# Patient Record
Sex: Male | Born: 1985 | Race: Black or African American | Hispanic: No | Marital: Single | Smoking: Never smoker
Health system: Southern US, Community
[De-identification: ages and names within clinical notes are randomized; demographics above are authoritative.]

---

## 2017-06-13 ENCOUNTER — Emergency Department (HOSPITAL_COMMUNITY)
Admission: EM | Admit: 2017-06-13 | Discharge: 2017-06-14 | Disposition: A | Discharge status: Home / Self Care | Payer: Medicaid Other | Attending: Emergency Medicine | Admitting: Emergency Medicine

## 2017-06-13 ENCOUNTER — Emergency Department (HOSPITAL_COMMUNITY): Payer: Medicaid Other

## 2017-06-13 ENCOUNTER — Encounter (HOSPITAL_COMMUNITY): Payer: Self-pay | Admitting: Emergency Medicine

## 2017-06-13 DIAGNOSIS — J020 Streptococcal pharyngitis: Secondary | ICD-10-CM

## 2017-06-13 LAB — CBC WITH DIFFERENTIAL/PLATELET
BASOS ABS: 0 10*3/uL (ref 0.0–0.1)
BASOS PCT: 0 %
EOS ABS: 0 10*3/uL (ref 0.0–0.7)
Eosinophils Relative: 0 %
HEMATOCRIT: 44.2 % (ref 39.0–52.0)
HEMOGLOBIN: 15.2 g/dL (ref 13.0–17.0)
Lymphocytes Relative: 9 %
Lymphs Abs: 1.4 10*3/uL (ref 0.7–4.0)
MCH: 27.9 pg (ref 26.0–34.0)
MCHC: 34.4 g/dL (ref 30.0–36.0)
MCV: 81.3 fL (ref 78.0–100.0)
MONO ABS: 0.8 10*3/uL (ref 0.1–1.0)
Monocytes Relative: 5 %
Neutro Abs: 12.9 10*3/uL — ABNORMAL HIGH (ref 1.7–7.7)
Neutrophils Relative %: 86 %
Platelets: 193 10*3/uL (ref 150–400)
RBC: 5.44 MIL/uL (ref 4.22–5.81)
RDW: 12.6 % (ref 11.5–15.5)
WBC: 15.1 10*3/uL — AB (ref 4.0–10.5)

## 2017-06-13 LAB — COMPREHENSIVE METABOLIC PANEL
ALBUMIN: 3.8 g/dL (ref 3.5–5.0)
ALK PHOS: 64 U/L (ref 38–126)
ALT: 18 U/L (ref 17–63)
AST: 24 U/L (ref 15–41)
Anion gap: 11 (ref 5–15)
BILIRUBIN TOTAL: 0.9 mg/dL (ref 0.3–1.2)
BUN: 11 mg/dL (ref 6–20)
CALCIUM: 8.9 mg/dL (ref 8.9–10.3)
CO2: 24 mmol/L (ref 22–32)
Chloride: 99 mmol/L — ABNORMAL LOW (ref 101–111)
Creatinine, Ser: 1.3 mg/dL — ABNORMAL HIGH (ref 0.61–1.24)
GFR calc Af Amer: >60 mL/min (ref >60)
GLUCOSE: 110 mg/dL — AB (ref 65–99)
Potassium: 3.3 mmol/L — ABNORMAL LOW (ref 3.5–5.1)
Sodium: 134 mmol/L — ABNORMAL LOW (ref 135–145)
TOTAL PROTEIN: 7.1 g/dL (ref 6.5–8.1)

## 2017-06-13 LAB — URINALYSIS, ROUTINE W REFLEX MICROSCOPIC
Bilirubin Urine: NEGATIVE
Glucose, UA: NEGATIVE mg/dL
Ketones, ur: NEGATIVE mg/dL
LEUKOCYTES UA: NEGATIVE
NITRITE: NEGATIVE
PROTEIN: 30 mg/dL — AB
Specific Gravity, Urine: 1.028 (ref 1.005–1.030)
pH: 7 (ref 5.0–8.0)

## 2017-06-13 LAB — I-STAT CG4 LACTIC ACID, ED: Lactic Acid, Venous: 1.65 mmol/L (ref 0.5–1.9)

## 2017-06-13 MED ORDER — ACETAMINOPHEN 325 MG PO TABS
650.0000 mg | ORAL_TABLET | Freq: Once | ORAL | Status: AC | PRN
  Administered 2017-06-13: 650 mg via ORAL
  Filled 2017-06-13: qty 2

## 2017-06-13 NOTE — ED Triage Notes (Signed)
Patient presents to ED for assessment of fever, sudden onset this evening, body aches.  Denies cough, denies congestion, denies sore throat, denies known exposure

## 2017-06-14 ENCOUNTER — Emergency Department (HOSPITAL_COMMUNITY): Payer: Medicaid Other

## 2017-06-14 LAB — GROUP A STREP BY PCR: GROUP A STREP BY PCR: DETECTED — AB

## 2017-06-14 MED ORDER — PENICILLIN V POTASSIUM 500 MG PO TABS: 500.0000 mg | ORAL_TABLET | Freq: Four times a day (QID) | ORAL | 0 refills | Status: AC

## 2017-06-14 MED ORDER — PENICILLIN V POTASSIUM 250 MG PO TABS
500.0000 mg | ORAL_TABLET | Freq: Once | ORAL | Status: AC
  Administered 2017-06-14: 500 mg via ORAL
  Filled 2017-06-14: qty 2

## 2017-06-14 MED ORDER — DEXAMETHASONE SODIUM PHOSPHATE 10 MG/ML IJ SOLN
10.0000 mg | Freq: Once | INTRAMUSCULAR | Status: AC
  Administered 2017-06-14: 10 mg via INTRAVENOUS
  Filled 2017-06-14: qty 1

## 2017-06-14 MED ORDER — SODIUM CHLORIDE 0.9 % IV BOLUS
1000.0000 mL | Freq: Once | INTRAVENOUS | Status: AC
  Administered 2017-06-14: 1000 mL via INTRAVENOUS

## 2017-06-14 MED ORDER — IBUPROFEN 400 MG PO TABS
600.0000 mg | ORAL_TABLET | Freq: Once | ORAL | Status: AC
  Administered 2017-06-14: 600 mg via ORAL
  Filled 2017-06-14: qty 1

## 2017-06-14 NOTE — Discharge Instructions (Addendum)
Try to drink lots of fluids. Sometimes cold liquids are easier to drink with the strep throat. Take ibuprofen 600 mg + acetaminophen 1000 mg every 6 hrs for fever and body aches. Return to the ED if you are unable to swallow, have difficulty breathing or you feel like you are dehydrated.  You are still contagious until you have been on the antibiotics for at least 24 hours.  Do not let anybody kiss you, drink out of your cup, or share your silverware.

## 2017-06-14 NOTE — ED Provider Notes (Signed)
[facility] EMERGENCY DEPARTMENT Provider Note   CSN: [id number] Arrival date & time: 06/13/17  2043  Time seen 3:19 AM   History   Chief Complaint Chief Complaint  Patient presents with  . Fever    HPI Michael Ellis is a 32 y.o. male.  HPI patient states that this morning he started feeling cold and having diffuse body aches.  He states he has a holoacranial headache that is steady without throbbing.  Nothing makes it feel worse, nothing makes it feel better.  He denies any visual changes, photophobia, coughing, rhinorrhea, sneezing, earache, nausea, vomiting, diarrhea, chest pain, or abdominal pain.  He states sometimes he feels a little short of breath.  Sometimes he has some phonophobia.  He denies any neck pain.  He denies being around anybody who is sick.  He states he did not get the flu shot this season.  PCP none  History reviewed. No pertinent past medical history.  There are no active problems to display for this patient.   History reviewed. No pertinent surgical history.      Home Medications    none  Prior to Admission medications   Medication Sig Start Date End Date Taking? Authorizing Provider  penicillin v potassium (VEETID) 500 MG tablet Take 1 tablet (500 mg total) by mouth 4 (four) times daily for 7 days. 06/14/17 06/21/17  Devoria Albe, MD    Family History History reviewed. No pertinent family history.  Social History Social History   Tobacco Use  . Smoking status: Never Smoker  . Smokeless tobacco: Never Used  Substance Use Topics  . Alcohol use: Yes    Alcohol/week: 0.6 oz    Types: 1 Cans of beer per week    Comment: "most days"  . Drug use: Never  employed   Allergies   Patient has no allergy information on record.   Review of Systems Review of Systems  All other systems reviewed and are negative.    Physical Exam Updated Vital Signs BP 102/71   Pulse (!) 111   Temp (!) 102 F (38.9 C) (Oral)   Resp 16    SpO2 96%   Vital signs normal except for fever and tachycardia   Physical Exam  Constitutional: He is oriented to person, place, and time. He appears well-developed and well-nourished.  Non-toxic appearance. He does not appear ill. No distress.  HENT:  Head: Normocephalic and atraumatic.  Right Ear: External ear normal.  Left Ear: External ear normal.  Nose: Nose normal. No mucosal edema or rhinorrhea.  Mouth/Throat: Uvula is midline and mucous membranes are normal. No dental abscesses or uvula swelling. Posterior oropharyngeal erythema present.  Speech is soft but normal  Eyes: Pupils are equal, round, and reactive to light. Conjunctivae and EOM are normal.  Neck: Normal range of motion and full passive range of motion without pain. Neck supple.  Cardiovascular: Normal rate, regular rhythm and normal heart sounds. Exam reveals no gallop and no friction rub.  No murmur heard. Pulmonary/Chest: Effort normal and breath sounds normal. No respiratory distress. He has no wheezes. He has no rhonchi. He has no rales. He exhibits no tenderness and no crepitus.  Abdominal: Soft. Normal appearance and bowel sounds are normal. He exhibits no distension. There is no tenderness. There is no rebound and no guarding.  Musculoskeletal: Normal range of motion. He exhibits no edema or tenderness.  Moves all extremities well.   Neurological: He is alert and oriented to person, place, and  time. He has normal strength. No cranial nerve deficit.  Skin: Skin is warm, dry and intact. No rash noted. No erythema. No pallor.  Psychiatric: He has a normal mood and affect. His speech is normal and behavior is normal. His mood appears not anxious.  Nursing note and vitals reviewed.    ED Treatments / Results  Labs (all labs ordered are listed, but only abnormal results are displayed) Results for orders placed or performed during the hospital encounter of 06/13/17  Group A Strep by PCR  Result Value Ref Range    Group A Strep by PCR DETECTED (A) NOT DETECTED  Comprehensive metabolic panel  Result Value Ref Range   Sodium 134 (L) 135 - 145 mmol/L   Potassium 3.3 (L) 3.5 - 5.1 mmol/L   Chloride 99 (L) 101 - 111 mmol/L   CO2 24 22 - 32 mmol/L   Glucose, Bld 110 (H) 65 - 99 mg/dL   BUN 11 6 - 20 mg/dL   Creatinine, Ser 7.82 (H) 0.61 - 1.24 mg/dL   Calcium 8.9 8.9 - 95.6 mg/dL   Total Protein 7.1 6.5 - 8.1 g/dL   Albumin 3.8 3.5 - 5.0 g/dL   AST 24 15 - 41 U/L   ALT 18 17 - 63 U/L   Alkaline Phosphatase 64 38 - 126 U/L   Total Bilirubin 0.9 0.3 - 1.2 mg/dL   GFR calc non Af Amer >60 >60 mL/min   GFR calc Af Amer >60 >60 mL/min   Anion gap 11 5 - 15  CBC with Differential  Result Value Ref Range   WBC 15.1 (H) 4.0 - 10.5 K/uL   RBC 5.44 4.22 - 5.81 MIL/uL   Hemoglobin 15.2 13.0 - 17.0 g/dL   HCT 21.3 08.6 - 57.8 %   MCV 81.3 78.0 - 100.0 fL   MCH 27.9 26.0 - 34.0 pg   MCHC 34.4 30.0 - 36.0 g/dL   RDW 46.9 62.9 - 52.8 %   Platelets 193 150 - 400 K/uL   Neutrophils Relative % 86 %   Neutro Abs 12.9 (H) 1.7 - 7.7 K/uL   Lymphocytes Relative 9 %   Lymphs Abs 1.4 0.7 - 4.0 K/uL   Monocytes Relative 5 %   Monocytes Absolute 0.8 0.1 - 1.0 K/uL   Eosinophils Relative 0 %   Eosinophils Absolute 0.0 0.0 - 0.7 K/uL   Basophils Relative 0 %   Basophils Absolute 0.0 0.0 - 0.1 K/uL  Urinalysis, Routine w reflex microscopic  Result Value Ref Range   Color, Urine YELLOW YELLOW   APPearance CLEAR CLEAR   Specific Gravity, Urine 1.028 1.005 - 1.030   pH 7.0 5.0 - 8.0   Glucose, UA NEGATIVE NEGATIVE mg/dL   Hgb urine dipstick MODERATE (A) NEGATIVE   Bilirubin Urine NEGATIVE NEGATIVE   Ketones, ur NEGATIVE NEGATIVE mg/dL   Protein, ur 30 (A) NEGATIVE mg/dL   Nitrite NEGATIVE NEGATIVE   Leukocytes, UA NEGATIVE NEGATIVE   RBC / HPF 11-20 0 - 5 RBC/hpf   WBC, UA 6-10 0 - 5 WBC/hpf   Bacteria, UA RARE (A) NONE SEEN   Squamous Epithelial / LPF 0-5 0 - 5   Mucus PRESENT   I-Stat CG4 Lactic Acid,  ED  Result Value Ref Range   Lactic Acid, Venous 1.65 0.5 - 1.9 mmol/L   Laboratory interpretation all normal except leukocytosis, mild hyponatremia and low chloride consistent with dehydration, elevated creatinine, possible UTI, + strep    EKG None  Radiology Dg Chest 2 View  Result Date: 06/13/2017 CLINICAL DATA:  32 year old male with fever. EXAM: CHEST - 2 VIEW COMPARISON:  None. FINDINGS: There are minimal bibasilar atelectatic changes. Mild right lung base interstitial nodularity, likely atelectasis. There is no focal consolidation, effusion, pneumothorax. The cardiac silhouette is within normal limits. No acute osseous pathology. IMPRESSION: No focal consolidation. Electronically Signed   By: Elgie Collard M.D.   On: 06/13/2017 21:24   Ct Renal Stone Study  Result Date: 06/14/2017 CLINICAL DATA:  32 year old male with fever of unknown origin. Flank pain. EXAM: CT ABDOMEN AND PELVIS WITHOUT CONTRAST TECHNIQUE: Multidetector CT imaging of the abdomen and pelvis was performed following the standard protocol without IV contrast. COMPARISON:  None. FINDINGS: Evaluation of this exam is limited in the absence of intravenous contrast. Lower chest: The visualized lung bases are clear. No intra-abdominal free air or free fluid. Hepatobiliary: Mild fatty liver. No intrahepatic biliary ductal dilatation. The gallbladder is unremarkable. Pancreas: Unremarkable. No pancreatic ductal dilatation or surrounding inflammatory changes. Spleen: Normal in size without focal abnormality. Adrenals/Urinary Tract: The adrenal glands are unremarkable. The kidneys, visualized ureters, and urinary bladder appear unremarkable as well. There is no hydronephrosis or nephrolithiasis on either side. Stomach/Bowel: There is no bowel obstruction or active inflammation. Normal appendix. Vascular/Lymphatic: No significant vascular findings are present. No enlarged abdominal or pelvic lymph nodes. Reproductive: The prostate  and seminal vesicles are grossly unremarkable. No pelvic mass. Other: There is mild engorgement of the mesenteric vessels with an area of apparent haziness in the fat plane surrounding the SMA (series 3 image 27-29). CT of the abdomen pelvis may provide better evaluation if there is concern for underlying vascular pathology. Musculoskeletal: No acute or significant osseous findings. IMPRESSION: 1. No hydronephrosis or nephrolithiasis. 2. No bowel obstruction or active inflammation.  Normal appendix. 3. Mild fatty liver. Electronically Signed   By: Elgie Collard M.D.   On: 06/14/2017 04:27    Procedures Procedures (including critical care time)  Medications Ordered in ED Medications  acetaminophen (TYLENOL) tablet 650 mg (650 mg Oral Given 06/13/17 2053)  ibuprofen (ADVIL,MOTRIN) tablet 600 mg (600 mg Oral Given 06/14/17 0333)  sodium chloride 0.9 % bolus 1,000 mL (0 mLs Intravenous Stopped 06/14/17 0511)  sodium chloride 0.9 % bolus 1,000 mL (0 mLs Intravenous Stopped 06/14/17 0506)  dexamethasone (DECADRON) injection 10 mg (10 mg Intravenous Given 06/14/17 0516)  penicillin v potassium (VEETID) tablet 500 mg (500 mg Oral Given 06/14/17 0516)     Initial Impression / Assessment and Plan / ED Course  I have reviewed the triage vital signs and the nursing notes.  Pertinent labs & imaging results that were available during my care of the patient were reviewed by me and considered in my medical decision making (see chart for details).     Patient's fever was treated, he was given IV fluids.  Rapid strep screen was ordered.  After reviewing his urinalysis I went back to talk to patient.  He states he is having bilateral flank pain that radiates around to his abdomen.  We discussed getting a CT renal scan and he is agreeable.  At this point I am considering he may have a kidney stone that is infected or a simple pyelonephritis which would be unusual for a male.  He denies dysuria but states he has  been having frequency.  Patient strep test has come back positive.  Patient was given the option of getting Bicillin IM or oral penicillin and he  chose oral penicillin.  He was given Decadron to help with the strep pharyngitis and hopefully help him be able to drink better.  Final Clinical Impressions(s) / ED Diagnoses   Final diagnoses:  Strep pharyngitis    ED Discharge Orders        Ordered    penicillin v potassium (VEETID) 500 MG tablet  4 times daily     06/14/17 0606    OTC ibuprofen and acetaminophen  Plan discharge  Devoria Albe, MD, Concha Pyo, MD 06/14/17 [phone]

## 2017-06-15 ENCOUNTER — Telehealth: Payer: Self-pay | Admitting: Emergency Medicine

## 2017-06-15 LAB — URINE CULTURE: CULTURE: NO GROWTH

## 2017-06-15 NOTE — Telephone Encounter (Signed)
Post ED Visit - Positive Culture Follow-up  Culture report reviewed by antimicrobial stewardship pharmacist:   Enzo Bi, Pharm.D.  Celedonio Miyamoto, Pharm.D., BCPS AQ-ID  Garvin Fila, Pharm.D., BCPS  Georgina Pillion, [address].D., BCPS  [place], [address].D., BCPS, AAHIVP  Estella Husk, Pharm.D., BCPS, AAHIVP  Lysle Pearl, PharmD, BCPS  Sherlynn Carbon, PharmD  Pollyann Samples, PharmD, BCPS  Positive strep culture Treated with pencillin, organism sensitive to the same and no further patient follow-up is required at this time.  Berle Mull 06/15/2017, 1:46 PM

## 2017-10-14 ENCOUNTER — Encounter (HOSPITAL_COMMUNITY): Payer: Self-pay | Admitting: Emergency Medicine

## 2017-10-14 ENCOUNTER — Emergency Department (HOSPITAL_COMMUNITY)
Admission: EM | Admit: 2017-10-14 | Discharge: 2017-10-14 | Disposition: A | Discharge status: Home / Self Care | Payer: Medicaid Other | Attending: Emergency Medicine | Admitting: Emergency Medicine

## 2017-10-14 DIAGNOSIS — K029 Dental caries, unspecified: Secondary | ICD-10-CM | POA: Diagnosis present

## 2017-10-14 DIAGNOSIS — K047 Periapical abscess without sinus: Secondary | ICD-10-CM | POA: Insufficient documentation

## 2017-10-14 MED ORDER — AMOXICILLIN 500 MG PO CAPS: 500.0000 mg | ORAL_CAPSULE | Freq: Three times a day (TID) | ORAL | 0 refills | Status: DC

## 2017-10-14 MED ORDER — NAPROXEN 500 MG PO TABS: 500.0000 mg | ORAL_TABLET | Freq: Two times a day (BID) | ORAL | 0 refills | Status: DC

## 2017-10-14 MED ORDER — AMOXICILLIN 500 MG PO CAPS: 500.0000 mg | ORAL_CAPSULE | Freq: Three times a day (TID) | ORAL | 0 refills | Status: AC

## 2017-10-14 MED ORDER — NAPROXEN 500 MG PO TABS: 500.0000 mg | ORAL_TABLET | Freq: Two times a day (BID) | ORAL | 0 refills | Status: AC

## 2017-10-14 NOTE — ED Provider Notes (Signed)
[facility] EMERGENCY DEPARTMENT Provider Note   CSN: [id number] Arrival date & time: 10/14/17  1700     History   Chief Complaint Chief Complaint  Patient presents with  . Dental Pain    HPI Michael Ellis is a 32 y.o. male who presents to the ED with dental pain. The pain started 2 months ago and has gotten progressively worse. Patient does not have a dentist. Patient has taken OTC medication without relief. Patient does not have a dentist. The pain is located in the right upper dental area.  HPI  History reviewed. No pertinent past medical history.  There are no active problems to display for this patient.   History reviewed. No pertinent surgical history.      Home Medications    Prior to Admission medications   Medication Sig Start Date End Date Taking? Authorizing Provider  amoxicillin (AMOXIL) 500 MG capsule Take 1 capsule (500 mg total) by mouth 3 (three) times daily. 10/14/17   Janne Napoleon, NP  naproxen (NAPROSYN) 500 MG tablet Take 1 tablet (500 mg total) by mouth 2 (two) times daily. 10/14/17   Janne Napoleon, NP    Family History History reviewed. No pertinent family history.  Social History Social History   Tobacco Use  . Smoking status: Never Smoker  . Smokeless tobacco: Never Used  Substance Use Topics  . Alcohol use: Yes    Alcohol/week: 1.0 standard drinks    Types: 1 Cans of beer per week    Comment: "most days"  . Drug use: Never     Allergies   Patient has no allergy information on record.   Review of Systems Review of Systems  HENT: Positive for dental problem.   All other systems reviewed and are negative.    Physical Exam Updated Vital Signs BP 109/78 (BP Location: Left Arm)   Pulse 81   Temp 98.1 F (36.7 C)   Resp 18   SpO2 99%   Physical Exam  Constitutional: He appears well-developed and well-nourished. No distress.  HENT:  Head: Normocephalic.  Mouth/Throat: Oropharynx is clear and moist.  Abnormal dentition.    Right upper third molar with decay and tender on exam.   Eyes: EOM are normal.  Neck: Neck supple.  Cardiovascular: Normal rate.  Pulmonary/Chest: Effort normal.  Musculoskeletal: Normal range of motion.  Lymphadenopathy:    He has no cervical adenopathy.  Neurological: He is alert.  Skin: Skin is warm and dry.  Psychiatric: He has a normal mood and affect.  Nursing note and vitals reviewed.    ED Treatments / Results  Labs (all labs ordered are listed, but only abnormal results are displayed) Labs Reviewed - No data to display Radiology No results found.  Procedures Procedures (including critical care time)  Medications Ordered in ED Medications - No data to display   Initial Impression / Assessment and Plan / ED Course  I have reviewed the triage vital signs and the nursing notes. Patient with toothache.  No gross abscess.  Exam unconcerning for Ludwig's angina or spread of infection.  Will treat with Amoxicillin and anti-inflammatories medicine.  Urged patient to follow-up with dentist.  Referral given. Final Clinical Impressions(s) / ED Diagnoses   Final diagnoses:  Dental caries  Dental infection    ED Discharge Orders         Ordered    amoxicillin (AMOXIL) 500 MG capsule  3 times daily     10/14/17 1759  naproxen (NAPROSYN) 500 MG tablet  2 times daily     10/14/17 1759           Kerrie [place], NP 10/14/17 1805    Tegeler, Canary Brim, MD 10/15/17 [phone]

## 2017-10-14 NOTE — ED Notes (Signed)
Patient able to ambulate independently  

## 2017-10-14 NOTE — Discharge Instructions (Addendum)
Call and schedule an appointment for follow up. Return here as needed

## 2017-10-14 NOTE — ED Triage Notes (Signed)
Pt presents for 2 months of upper right dental pain, sts no dentist.

## 2019-02-08 IMAGING — CR DG CHEST 2V
2 series · 2 of 2 positions shown · non-contrast
Comparison: None.

CLINICAL DATA: 32-year-old male with fever.

EXAM:
CHEST - 2 VIEW

[chest pa]
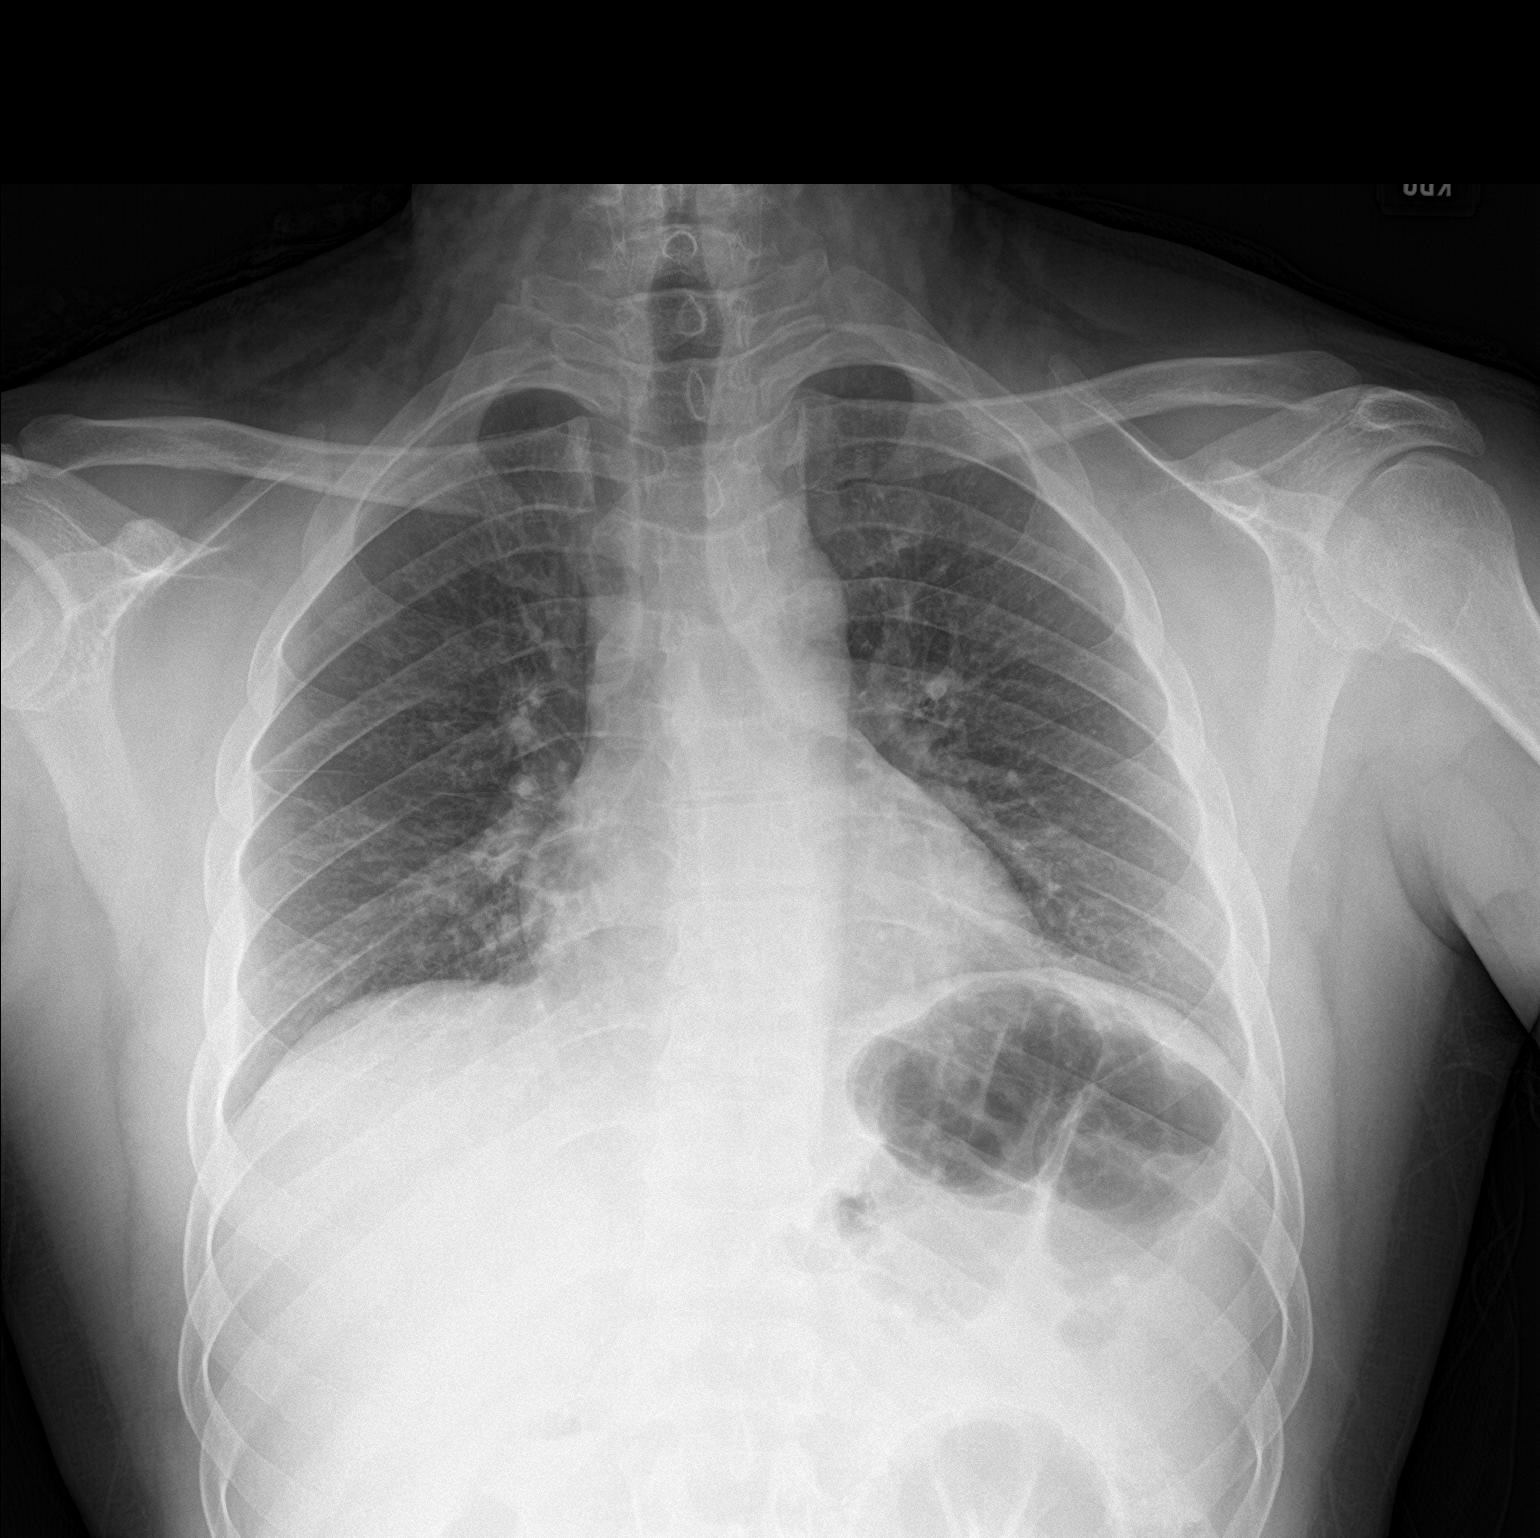

[chest lat]
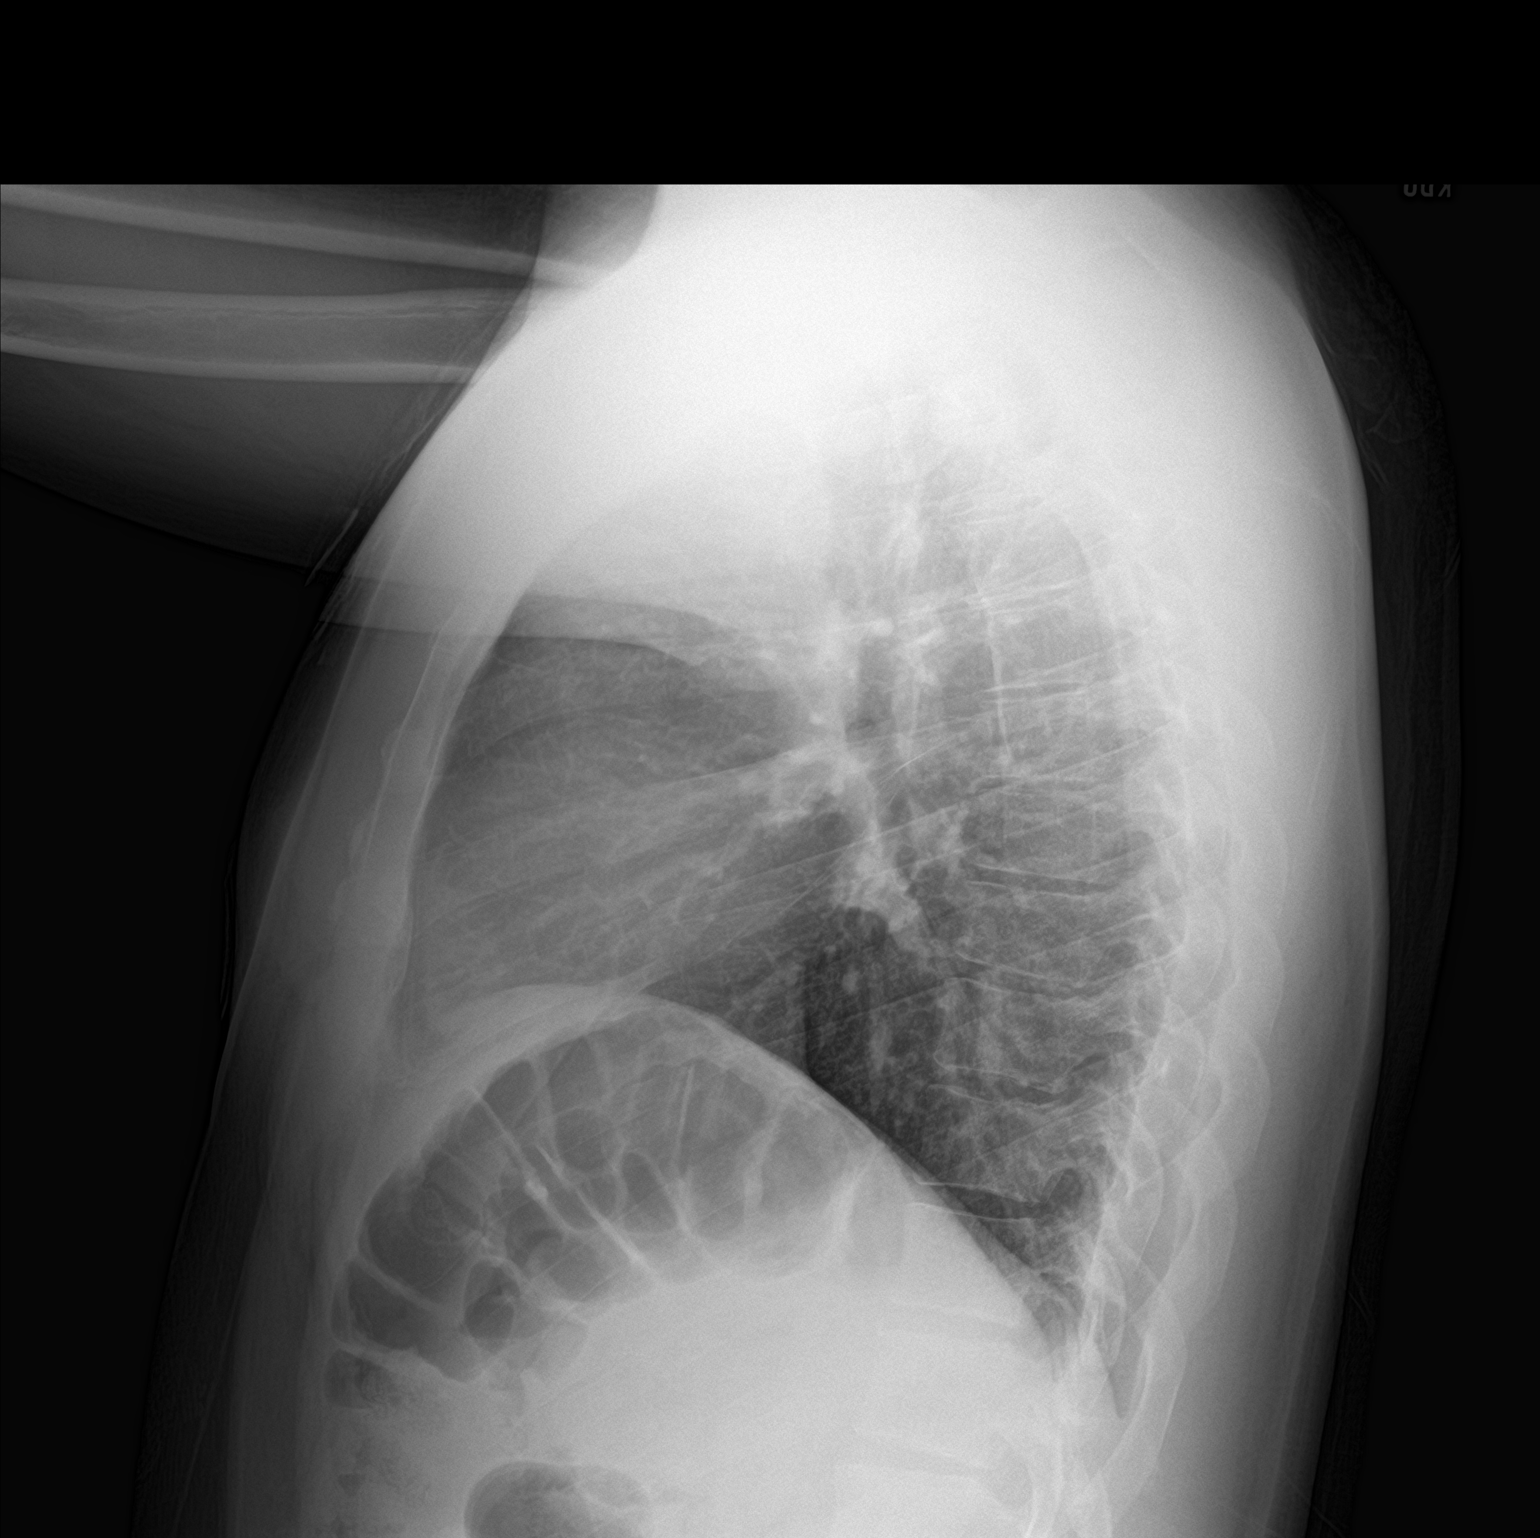

[2 of 2 positions shown; findings below may reference images not displayed]

FINDINGS: There are minimal bibasilar atelectatic changes. Mild right lung
base interstitial nodularity, likely atelectasis. There is no focal
consolidation, effusion, pneumothorax. The cardiac silhouette is
within normal limits. No acute osseous pathology.
IMPRESSION: No focal consolidation.
